# Patient Record
Sex: Male | Born: 1975 | Race: Black or African American | Hispanic: No | Marital: Single | Smoking: Current every day smoker
Health system: Southern US, Community
[De-identification: ages and names within clinical notes are randomized; demographics above are authoritative.]

---

## 2011-05-05 ENCOUNTER — Encounter (HOSPITAL_COMMUNITY): Payer: Self-pay

## 2011-05-05 ENCOUNTER — Emergency Department (HOSPITAL_COMMUNITY)
Admission: EM | Admit: 2011-05-05 | Discharge: 2011-05-05 | Disposition: A | Discharge status: Home / Self Care | Payer: Self-pay | Attending: Emergency Medicine | Admitting: Emergency Medicine

## 2011-05-05 DIAGNOSIS — Y92410 Unspecified street and highway as the place of occurrence of the external cause: Secondary | ICD-10-CM | POA: Insufficient documentation

## 2011-05-05 DIAGNOSIS — H00019 Hordeolum externum unspecified eye, unspecified eyelid: Secondary | ICD-10-CM | POA: Insufficient documentation

## 2011-05-05 DIAGNOSIS — L03213 Periorbital cellulitis: Secondary | ICD-10-CM

## 2011-05-05 DIAGNOSIS — IMO0002 Reserved for concepts with insufficient information to code with codable children: Secondary | ICD-10-CM | POA: Insufficient documentation

## 2011-05-05 DIAGNOSIS — H00039 Abscess of eyelid unspecified eye, unspecified eyelid: Secondary | ICD-10-CM | POA: Insufficient documentation

## 2011-05-05 DIAGNOSIS — T189XXA Foreign body of alimentary tract, part unspecified, initial encounter: Secondary | ICD-10-CM | POA: Insufficient documentation

## 2011-05-05 MED ORDER — CEPHALEXIN 500 MG PO CAPS: 500.0000 mg | ORAL_CAPSULE | Freq: Four times a day (QID) | ORAL | Status: AC

## 2011-05-05 MED ORDER — ERYTHROMYCIN 5 MG/GM OP OINT
1.0000 "application " | TOPICAL_OINTMENT | Freq: Once | OPHTHALMIC | Status: AC
  Administered 2011-05-05: 1 via OPHTHALMIC
  Filled 2011-05-05: qty 1

## 2011-05-05 MED ORDER — CEPHALEXIN 500 MG PO CAPS: 500.0000 mg | ORAL_CAPSULE | Freq: Four times a day (QID) | ORAL | Status: DC

## 2011-05-05 NOTE — ED Notes (Signed)
Pt here for possible ingestion of cocaine by gpd, also with cellulitis to eyes. Per dr Hyacinth Meeker hordoleum noted with periorbital cellulitis to right eye.

## 2011-05-05 NOTE — ED Provider Notes (Signed)
History     CSN: [id number]  Arrival date & time 05/05/11  0345   First MD Initiated Contact with Patient 05/05/11 [phone]      Chief Complaint  Patient presents with  . Swallowed Foreign Body    (Consider location/radiation/quality/duration/timing/severity/associated sxs/prior treatment) HPI Comments: According to Police, they pulled the pt over at traffic stop and caught him throwing cocaine in bags out of the car.  They did not witness any ingestions - they took him to jail - medical staff thought that he was a bit quiet and sleepy and requested ED evaluation by physician.  Pt has no c/o vomiting, no seizures, no c/o other than R eyelid swelling for 24 hours.   Pt denies swallowing any drugs.  Denies ETOH use  Patient is a 36 y.o. male presenting with foreign body swallowed. The history is provided by the patient and the police.  Swallowed Foreign Body    History reviewed. No pertinent past medical history.  History reviewed. No pertinent past surgical history.  History reviewed. No pertinent family history.  History  Substance Use Topics  . Smoking status: Current Everyday Smoker  . Smokeless tobacco: Not on file  . Alcohol Use:       Review of Systems  All other systems reviewed and are negative.    Allergies  Review of patient's allergies indicates not on file.  Home Medications   Current Outpatient Rx  Name Route Sig Dispense Refill  . CEPHALEXIN 500 MG PO CAPS Oral Take 1 capsule (500 mg total) by mouth 4 (four) times daily. 40 capsule 0    BP 137/81  Pulse 53  Temp(Src) 97.5 F (36.4 C) (Oral)  Resp 20  SpO2 95%  Physical Exam  Nursing note and vitals reviewed. Constitutional: He appears well-developed and well-nourished. No distress.  HENT:  Head: Normocephalic and atraumatic.  Mouth/Throat: Oropharynx is clear and moist. No oropharyngeal exudate.  Eyes: Conjunctivae and EOM are normal. Pupils are equal, round, and reactive to light. Right eye  exhibits no discharge. Left eye exhibits no discharge. No scleral icterus.       Right upper eyelid with erythema and mild tender with hordeolum present on lid margin.  No pain with EOM, normal pupillary exam bil.  Pupils are 3 mm and reaactive bil  Neck: Normal range of motion. Neck supple. No JVD present. No thyromegaly present.  Cardiovascular: Normal rate, regular rhythm, normal heart sounds and intact distal pulses.  Exam reveals no gallop and no friction rub.   No murmur heard. Pulmonary/Chest: Effort normal and breath sounds normal. No respiratory distress. He has no wheezes. He has no rales.  Abdominal: Soft. Bowel sounds are normal. He exhibits no distension and no mass. There is no tenderness.  Musculoskeletal: Normal range of motion. He exhibits no edema and no tenderness.  Lymphadenopathy:    He has no cervical adenopathy.  Neurological: He is alert. Coordination normal.  Skin: Skin is warm and dry. No rash noted. No erythema.  Psychiatric: He has a normal mood and affect. His behavior is normal.    ED Course  Procedures (including critical care time)  Labs Reviewed - No data to display No results found.   1. Periorbital cellulitis   2. Stye       MDM  Normal heart rate, normal abdominal exam, no signs of foreign body in the mouth. Patient does not appear to have ingested anything significant, has normal vital signs but does have a periorbital cellulitis of  the right eye along with a hordeolum. Antibiotics topical in the emergency department, Keflex by mouth for 10 days, prescription given, discharged back to prison in care of police. Patient appears stable at this time        Vida Roller, MD 05/05/11 [phone]

## 2011-05-05 NOTE — ED Notes (Signed)
MD at bedside. 

## 2011-05-05 NOTE — Discharge Instructions (Signed)
Periorbital Cellulitis Periorbital cellulitis is a common infection that can affect the eyelid and the soft tissues that surround the eyeball. The infection may also affect the structures that produce and drain tears. It does not affect the eyeball itself. Natural tissue barriers usually prevent the spread of this infection to the eyeball and other deeper areas of the eye socket.  CAUSES  Bacterial infection.   Long-term (chronic) sinus infections.   An object (foreign body) stuck behind the eye.   An injury that goes through the eyelid tissues.   An injury that causes an infection, such as an insect sting.   Fracture of the bone around the eye.   Infections which have spread from the eyelid or other structures around the eye.   Bite wounds.   Inflammation or infection of the lining membranes of the brain (meningitis).   An infection in the blood (septicemia).   Dental infection (abscess).   Viral infection (this is rare).  SYMPTOMS Symptoms usually come on suddenly.  Pain in the eye.   Red, hot, and swollen eyelids and possibly cheeks. The swelling is sometimes bad enough that the eyelids cannot open. Some infections make the eyelids look purple.   Fever and feeling generally ill.   Pain when touching the area around the eye.  DIAGNOSIS  Periorbital cellulitis can be diagnosed from an eye exam. In severe cases, your caregiver might suggest:  Blood tests.   Imaging tests (such as a CT scan) to examine the sinuses and the area around and behind the eyeball.  TREATMENT If your caregiver feels that you do not have any signs of serious infection, treatment may include:  Antibiotics.   Nasal decongestants to reduce swelling.   Referral to a dentist if it is suspected that the infection was caused by a prior tooth infection.   Examination every day to make sure the problem is improving.  HOME CARE INSTRUCTIONS  Take your antibiotics as directed. Finish them even  if you start to feel better.   Some pain is normal with this condition. Take pain medicine as directed by your caregiver. Only take pain medicines approved by your caregiver.   It is important to drink fluids. Drink enough water and fluids to keep your urine clear or pale yellow.   Do not smoke.   Rest and get plenty of sleep.   Mild or moderate fevers generally have no long-term effects and often do not require treatment.   If your caregiver has given you a follow-up appointment, it is very important to keep that appointment. Your caregiver will need to make sure that the infection is getting better. It is important to check that a more serious infection is not developing.  SEEK IMMEDIATE MEDICAL CARE IF:  Your eyelids become more painful, red, warm, or swollen.   You develop double vision or your vision becomes blurred or worsens in any way.   You have trouble moving your eyes.   The eye looks like it is popping out (proptosis).   You develop a severe headache, severe neck pain, or neck stiffness.   You develop repeated vomiting.   You have a fever or persistent symptoms for more than 72 hours.   You have a fever and your symptoms suddenly get worse.  MAKE SURE YOU:  Understand these instructions.   Will watch your condition.   Will get help right away if you are not doing well or get worse.  Document Released: 03/07/2010 Document Revised: 01/22/2011   Document Reviewed: 03/07/2010 [facility] Patient Information 2012 [place], [place].Sty A sty (hordeolum) is an infection of a gland in the eyelid located at the base of the eyelash. A sty may develop a white or yellow head of pus. It can be puffy (swollen). Usually, the sty will burst and pus will come out on its own. They do not leave lumps in the eyelid once they drain. A sty is often confused with another form of cyst of the eyelid called a chalazion. Chalazions occur within the eyelid and not on the edge where the bases of the  eyelashes are. They often are red, sore and then form firm lumps in the eyelid. CAUSES   Germs (bacteria).   Lasting (chronic) eyelid inflammation.  SYMPTOMS   Tenderness, redness and swelling along the edge of the eyelid at the base of the eyelashes.   Sometimes, there is a white or yellow head of pus. It may or may not drain.  DIAGNOSIS  An ophthalmologist will be able to distinguish between a sty and a chalazion and treat the condition appropriately.  TREATMENT   Styes are typically treated with warm packs (compresses) until drainage occurs.   In rare cases, medicines that kill germs (antibiotics) may be prescribed. These antibiotics may be in the form of drops, cream or pills.   If a hard lump has formed, it is generally necessary to do a small incision and remove the hardened contents of the cyst in a minor surgical procedure done in the office.   In suspicious cases, your caregiver may send the contents of the cyst to the lab to be certain that it is not a rare, but dangerous form of cancer of the glands of the eyelid.  HOME CARE INSTRUCTIONS   Wash your hands often and dry them with a clean towel. Avoid touching your eyelid. This may spread the infection to other parts of the eye.   Apply heat to your eyelid for 10 to 20 minutes, several times a day, to ease pain and help to heal it faster.   Do not squeeze the sty. Allow it to drain on its own. Wash your eyelid carefully 3 to 4 times per day to remove any pus.  SEEK IMMEDIATE MEDICAL CARE IF:   Your eye becomes painful or puffy (swollen).   Your vision changes.   Your sty does not drain by itself within 3 days.   Your sty comes back within a short period of time, even with treatment.   You have redness (inflammation) around the eye.   You have a fever.  Document Released: 11/12/2004 Document Revised: 01/22/2011 Document Reviewed: 07/17/2008 [facility] Patient Information 2012 [place], [place].

## 2011-05-05 NOTE — ED Notes (Signed)
Pt here from jail for possible ingestion of cocaine bags unknown amt.

## 2012-05-21 ENCOUNTER — Emergency Department (HOSPITAL_COMMUNITY)
Admission: EM | Admit: 2012-05-21 | Discharge: 2012-05-21 | Disposition: A | Discharge status: Home / Self Care | Payer: Self-pay | Attending: Emergency Medicine | Admitting: Emergency Medicine

## 2012-05-21 ENCOUNTER — Encounter (HOSPITAL_COMMUNITY): Payer: Self-pay | Admitting: Emergency Medicine

## 2012-05-21 DIAGNOSIS — F17200 Nicotine dependence, unspecified, uncomplicated: Secondary | ICD-10-CM | POA: Insufficient documentation

## 2012-05-21 DIAGNOSIS — R1012 Left upper quadrant pain: Secondary | ICD-10-CM | POA: Insufficient documentation

## 2012-05-21 DIAGNOSIS — R109 Unspecified abdominal pain: Secondary | ICD-10-CM

## 2012-05-21 DIAGNOSIS — Z79899 Other long term (current) drug therapy: Secondary | ICD-10-CM | POA: Insufficient documentation

## 2012-05-21 DIAGNOSIS — R112 Nausea with vomiting, unspecified: Secondary | ICD-10-CM | POA: Insufficient documentation

## 2012-05-21 LAB — COMPREHENSIVE METABOLIC PANEL
BUN: 10 mg/dL (ref 6–23)
CO2: 28 mEq/L (ref 19–32)
Chloride: 102 mEq/L (ref 96–112)
Creatinine, Ser: 1.29 mg/dL (ref 0.50–1.35)
GFR calc non Af Amer: 69 mL/min — ABNORMAL LOW (ref >90)
Total Bilirubin: 0.3 mg/dL (ref 0.3–1.2)

## 2012-05-21 LAB — CBC WITH DIFFERENTIAL/PLATELET
Basophils Relative: 0 % (ref 0–1)
HCT: 38.5 % — ABNORMAL LOW (ref 39.0–52.0)
Hemoglobin: 12.7 g/dL — ABNORMAL LOW (ref 13.0–17.0)
Lymphocytes Relative: 42 % (ref 12–46)
MCHC: 33 g/dL (ref 30.0–36.0)
MCV: 75.8 fL — ABNORMAL LOW (ref 78.0–100.0)
Monocytes Absolute: 0.7 10*3/uL (ref 0.1–1.0)
Monocytes Relative: 10 % (ref 3–12)
Neutro Abs: 3.2 10*3/uL (ref 1.7–7.7)

## 2012-05-21 LAB — LIPASE, BLOOD: Lipase: 36 U/L (ref 11–59)

## 2012-05-21 MED ORDER — ACETAMINOPHEN 325 MG PO TABS
650.0000 mg | ORAL_TABLET | Freq: Once | ORAL | Status: AC
  Administered 2012-05-21: 650 mg via ORAL
  Filled 2012-05-21: qty 2

## 2012-05-21 MED ORDER — ONDANSETRON HCL 4 MG/2ML IJ SOLN
4.0000 mg | Freq: Once | INTRAMUSCULAR | Status: AC
  Administered 2012-05-21: 4 mg via INTRAVENOUS
  Filled 2012-05-21: qty 2

## 2012-05-21 MED ORDER — PANTOPRAZOLE SODIUM 40 MG IV SOLR
40.0000 mg | Freq: Once | INTRAVENOUS | Status: AC
  Administered 2012-05-21: 40 mg via INTRAVENOUS
  Filled 2012-05-21: qty 40

## 2012-05-21 MED ORDER — SODIUM CHLORIDE 0.9 % IV BOLUS (SEPSIS)
1000.0000 mL | Freq: Once | INTRAVENOUS | Status: AC
  Administered 2012-05-21: 1000 mL via INTRAVENOUS

## 2012-05-21 MED ORDER — PROMETHAZINE HCL 25 MG PO TABS: 25.0000 mg | ORAL_TABLET | Freq: Four times a day (QID) | ORAL | Status: DC | PRN

## 2012-05-21 NOTE — ED Notes (Signed)
Pt c/o abd pain, n/v x 3 days, worse last night after eating dinner.

## 2012-05-21 NOTE — ED Provider Notes (Signed)
History     CSN: [id number]  Arrival date & time 05/21/12  0154   First MD Initiated Contact with Patient 05/21/12 [phone]      Chief Complaint  Patient presents with  . Abdominal Pain  . Emesis    (Consider location/radiation/quality/duration/timing/severity/associated sxs/prior treatment) HPI  Jacobey Gura Redmon is a 37 y.o. male complaining of complaining of left upper quadrant abdominal pain x3 days with single episode of nonbloody nonbilious emesis today. Patient states the pain is 8/10, it is described as gnawing, patient has not taken any pain medications. He denies any change in bowel or bladder habits, fever, chest pain, shortness of breath.   History reviewed. No pertinent past medical history.  History reviewed. No pertinent past surgical history.  No family history on file.  History  Substance Use Topics  . Smoking status: Current Every Day Smoker  . Smokeless tobacco: Not on file  . Alcohol Use: Yes     Comment: occ      Review of Systems  Constitutional: Negative for fever.  Respiratory: Negative for shortness of breath.   Cardiovascular: Negative for chest pain.  Gastrointestinal: Positive for nausea, vomiting and abdominal pain. Negative for diarrhea.  All other systems reviewed and are negative.    Allergies  Review of patient's allergies indicates no known allergies.  Home Medications   Current Outpatient Rx  Name  Route  Sig  Dispense  Refill  . METFORMIN HCL PO   Oral   Take 1 tablet by mouth once.         . simethicone (MYLICON) 80 MG chewable tablet   Oral   Chew 80 mg by mouth every 6 (six) hours as needed for flatulence.           BP 137/79  Pulse 63  Temp(Src) 98.4 F (36.9 C) (Oral)  Resp 16  Ht 5\' 10"  (1.778 m)  SpO2 96%  Physical Exam  Nursing note and vitals reviewed. Constitutional: He is oriented to person, place, and time. He appears well-developed and well-nourished. No distress.  HENT:  Head: Normocephalic.   Mouth/Throat: Oropharynx is clear and moist.  Eyes: Conjunctivae and EOM are normal. Pupils are equal, round, and reactive to light.  Neck: Normal range of motion.  Cardiovascular: Normal rate, regular rhythm and intact distal pulses.   Pulmonary/Chest: Effort normal and breath sounds normal. No stridor. No respiratory distress. He has no wheezes. He has no rales. He exhibits no tenderness.  Abdominal: Soft. Bowel sounds are normal. He exhibits no distension and no mass. There is no tenderness. There is no rebound and no guarding.  Musculoskeletal: Normal range of motion.  Neurological: He is alert and oriented to person, place, and time.  Psychiatric: He has a normal mood and affect.    ED Course  Procedures (including critical care time)  Labs Reviewed  CBC WITH DIFFERENTIAL - Abnormal; Notable for the following:    Hemoglobin 12.7 (*)    HCT 38.5 (*)    MCV 75.8 (*)    MCH 25.0 (*)    All other components within normal limits  COMPREHENSIVE METABOLIC PANEL - Abnormal; Notable for the following:    Potassium 3.4 (*)    Glucose, Bld 136 (*)    GFR calc non Af Amer 69 (*)    GFR calc Af Amer 80 (*)    All other components within normal limits  LIPASE, BLOOD   No results found.   1. Abdominal pain   2. Nausea &  vomiting       MDM   Asaad Gulley is a 37 y.o. male with nausea vomiting and abdominal pain.  Patient is extremely somnolent and I think this is likely secondary to alcohol or drug use. This limits pain medications that  can safely effectively be utilized.  Patient has nonsurgical abdomen, this remains consistent over serial exams.  VSS Patient tolerated by mouth challenge is appropriate for discharge at this time. Return precautions given.   Filed Vitals:   05/21/12 0211  BP: 137/79  Pulse: 63  Temp: 98.4 F (36.9 C)  TempSrc: Oral  Resp: 16  Height: 5\' 10"  (1.778 m)  SpO2: 96%     Pt verbalized understanding and agrees with care plan.  Outpatient follow-up and return precautions given.    New Prescriptions   PROMETHAZINE (PHENERGAN) 25 MG TABLET    Take 1 tablet (25 mg total) by mouth every 6 (six) hours as needed for nausea.           Wynetta Emery, PA-C 05/23/12 1527

## 2012-05-21 NOTE — ED Notes (Signed)
Pt drank water with no adverse s/s

## 2012-05-24 NOTE — ED Provider Notes (Signed)
Medical screening examination/treatment/procedure(s) were performed by non-physician practitioner and as supervising physician I was immediately available for consultation/collaboration.  Sunnie Nielsen, MD 05/24/12 [phone]

## 2013-11-12 ENCOUNTER — Encounter (HOSPITAL_COMMUNITY): Payer: Self-pay | Admitting: Emergency Medicine

## 2013-11-12 ENCOUNTER — Emergency Department (HOSPITAL_COMMUNITY)
Admission: EM | Admit: 2013-11-12 | Discharge: 2013-11-12 | Disposition: A | Discharge status: Home / Self Care | Payer: Self-pay | Attending: Emergency Medicine | Admitting: Emergency Medicine

## 2013-11-12 DIAGNOSIS — F17200 Nicotine dependence, unspecified, uncomplicated: Secondary | ICD-10-CM | POA: Insufficient documentation

## 2013-11-12 DIAGNOSIS — R51 Headache: Secondary | ICD-10-CM | POA: Insufficient documentation

## 2013-11-12 DIAGNOSIS — R5383 Other fatigue: Secondary | ICD-10-CM

## 2013-11-12 DIAGNOSIS — R6883 Chills (without fever): Secondary | ICD-10-CM | POA: Insufficient documentation

## 2013-11-12 DIAGNOSIS — R519 Headache, unspecified: Secondary | ICD-10-CM

## 2013-11-12 DIAGNOSIS — R5381 Other malaise: Secondary | ICD-10-CM | POA: Insufficient documentation

## 2013-11-12 MED ORDER — ACETAMINOPHEN 500 MG PO TABS
1000.0000 mg | ORAL_TABLET | Freq: Once | ORAL | Status: AC
  Administered 2013-11-12: 1000 mg via ORAL
  Filled 2013-11-12: qty 2

## 2013-11-12 MED ORDER — METHOCARBAMOL 500 MG PO TABS
1000.0000 mg | ORAL_TABLET | Freq: Once | ORAL | Status: AC
  Administered 2013-11-12: 1000 mg via ORAL
  Filled 2013-11-12: qty 2

## 2013-11-12 MED ORDER — METHOCARBAMOL 750 MG PO TABS
750.0000 mg | ORAL_TABLET | Freq: Four times a day (QID) | ORAL | Status: AC | PRN
Start: 2013-11-12 — End: ?

## 2013-11-12 NOTE — ED Provider Notes (Signed)
CSN: [id number]     Arrival date & time 11/12/13  0409 History   First MD Initiated Contact with Patient 11/12/13 [phone]     Chief Complaint  Patient presents with  . Headache     (Consider location/radiation/quality/duration/timing/severity/associated sxs/prior Treatment) HPI 38 year old male presents to emergency department from with complaint of posterior headache.  Headache started around lunchtime today.  Patient does not normally have headaches.  No nausea vomiting photophobia.  No trauma to the area.  Patient has had some malaise with chills but no fever.  Patient reports palpation of the posterior part of his head and movement causes worsening pain.  Patient has not taken any medication for this pain prior to arrival. History reviewed. No pertinent past medical history. History reviewed. No pertinent past surgical history. No family history on file. History  Substance Use Topics  . Smoking status: Current Every Day Smoker  . Smokeless tobacco: Not on file  . Alcohol Use: Yes     Comment: occ    Review of Systems   See History of Present Illness; otherwise all other systems are reviewed and negative  Allergies  Review of patient's allergies indicates no known allergies.  Home Medications   Prior to Admission medications   Not on File   BP 133/94  Pulse 59  Temp(Src) 98.9 F (37.2 C) (Oral)  Resp 17  Ht 5' 10.5" (1.791 m)  Wt 190 lb (86.183 kg)  BMI 26.87 kg/m2  SpO2 97% Physical Exam  Nursing note and vitals reviewed. Constitutional: He is oriented to person, place, and time. He appears well-developed and well-nourished.  HENT:  Head: Normocephalic and atraumatic.  Right Ear: External ear normal.  Left Ear: External ear normal.  Nose: Nose normal.  Mouth/Throat: Oropharynx is clear and moist.  Occiput, no overlying skin changes, no erythema, no warmth no crepitus  Eyes: Conjunctivae and EOM are normal. Pupils are equal, round, and reactive to light.  Neck:  Normal range of motion. Neck supple. No JVD present. No tracheal deviation present. No thyromegaly present.  Patient has no nuchal rigidity.  Flexion and extension and rotation cause pain at the occiput but he has no focal findings to palpation of cervical spine  Cardiovascular: Normal rate, regular rhythm, normal heart sounds and intact distal pulses.  Exam reveals no gallop and no friction rub.   No murmur heard. Pulmonary/Chest: Effort normal and breath sounds normal. No stridor. No respiratory distress. He has no wheezes. He has no rales. He exhibits no tenderness.  Abdominal: Soft. Bowel sounds are normal. He exhibits no distension and no mass. There is no tenderness. There is no rebound and no guarding.  Musculoskeletal: Normal range of motion. He exhibits no edema and no tenderness.  Lymphadenopathy:    He has no cervical adenopathy.  Neurological: He is alert and oriented to person, place, and time. He displays normal reflexes. He exhibits normal muscle tone. Coordination normal.  Skin: Skin is warm and dry. No rash noted. No erythema. No pallor.  Psychiatric: He has a normal mood and affect. His behavior is normal. Judgment and thought content normal.    ED Course  Procedures (including critical care time) Labs Review Labs Reviewed - No data to display  Imaging Review No results found.   EKG Interpretation None      MDM   Final diagnoses:  Acute nonintractable headache, unspecified headache type    38 year old male with occipital headache.  Plan for treatment with Tylenol and Robaxin.  We'll  reassess.  I feel patient does not have meningitis, cervical spine arrangement, SAH or other serious cause of his headache.  Olivia Mackie, MD 11/12/13 [phone]

## 2013-11-12 NOTE — Discharge Instructions (Signed)
She may take Tylenol or a Irving Copas if you have any headache.  Robaxin as needed for muscle tension or spasm.  Return to the emergency department for worsening condition or new concerning symptoms.

## 2013-11-12 NOTE — ED Notes (Signed)
The pt is c/o a headache all day no n or v.  His pain is in the back of his head.  No hx of headaches

## 2013-11-15 ENCOUNTER — Encounter (HOSPITAL_COMMUNITY): Payer: Self-pay | Admitting: Emergency Medicine

## 2013-11-15 ENCOUNTER — Emergency Department (HOSPITAL_COMMUNITY)
Admission: EM | Admit: 2013-11-15 | Discharge: 2013-11-15 | Disposition: A | Discharge status: Home / Self Care | Payer: Self-pay | Attending: Emergency Medicine | Admitting: Emergency Medicine

## 2013-11-15 ENCOUNTER — Emergency Department (HOSPITAL_COMMUNITY): Payer: Self-pay

## 2013-11-15 DIAGNOSIS — R51 Headache: Secondary | ICD-10-CM | POA: Insufficient documentation

## 2013-11-15 DIAGNOSIS — R69 Illness, unspecified: Secondary | ICD-10-CM

## 2013-11-15 DIAGNOSIS — Z7982 Long term (current) use of aspirin: Secondary | ICD-10-CM | POA: Insufficient documentation

## 2013-11-15 DIAGNOSIS — B9789 Other viral agents as the cause of diseases classified elsewhere: Secondary | ICD-10-CM | POA: Insufficient documentation

## 2013-11-15 DIAGNOSIS — J029 Acute pharyngitis, unspecified: Secondary | ICD-10-CM | POA: Insufficient documentation

## 2013-11-15 DIAGNOSIS — B349 Viral infection, unspecified: Secondary | ICD-10-CM

## 2013-11-15 DIAGNOSIS — J111 Influenza due to unidentified influenza virus with other respiratory manifestations: Secondary | ICD-10-CM | POA: Insufficient documentation

## 2013-11-15 DIAGNOSIS — M791 Myalgia, unspecified site: Secondary | ICD-10-CM

## 2013-11-15 DIAGNOSIS — F17200 Nicotine dependence, unspecified, uncomplicated: Secondary | ICD-10-CM | POA: Insufficient documentation

## 2013-11-15 LAB — BASIC METABOLIC PANEL
Anion gap: 11 (ref 5–15)
BUN: 9 mg/dL (ref 6–23)
CALCIUM: 9.1 mg/dL (ref 8.4–10.5)
CO2: 24 meq/L (ref 19–32)
CREATININE: 1.1 mg/dL (ref 0.50–1.35)
Chloride: 105 mEq/L (ref 96–112)
GFR calc Af Amer: >90 mL/min (ref >90)
GFR, EST NON AFRICAN AMERICAN: 82 mL/min — AB (ref >90)
GLUCOSE: 133 mg/dL — AB (ref 70–99)
Potassium: 3.8 mEq/L (ref 3.7–5.3)
Sodium: 140 mEq/L (ref 137–147)

## 2013-11-15 LAB — CBC
HEMATOCRIT: 35.6 % — AB (ref 39.0–52.0)
HEMOGLOBIN: 11.7 g/dL — AB (ref 13.0–17.0)
MCH: 24.9 pg — AB (ref 26.0–34.0)
MCHC: 32.9 g/dL (ref 30.0–36.0)
MCV: 75.9 fL — AB (ref 78.0–100.0)
Platelets: 143 10*3/uL — ABNORMAL LOW (ref 150–400)
RBC: 4.69 MIL/uL (ref 4.22–5.81)
RDW: 14.7 % (ref 11.5–15.5)
WBC: 5.3 10*3/uL (ref 4.0–10.5)

## 2013-11-15 MED ORDER — ACETAMINOPHEN 325 MG PO TABS
650.0000 mg | ORAL_TABLET | Freq: Once | ORAL | Status: AC
  Administered 2013-11-15: 650 mg via ORAL
  Filled 2013-11-15: qty 2

## 2013-11-15 MED ORDER — OXYMETAZOLINE HCL 0.05 % NA SOLN
1.0000 | Freq: Once | NASAL | Status: AC
  Administered 2013-11-15: 1 via NASAL
  Filled 2013-11-15: qty 15

## 2013-11-15 MED ORDER — KETOROLAC TROMETHAMINE 30 MG/ML IJ SOLN
30.0000 mg | Freq: Once | INTRAMUSCULAR | Status: AC
  Administered 2013-11-15: 30 mg via INTRAVENOUS
  Filled 2013-11-15: qty 1

## 2013-11-15 MED ORDER — AEROCHAMBER PLUS W/MASK MISC
1.0000 | Freq: Once | Status: AC
  Administered 2013-11-15: 1
  Filled 2013-11-15: qty 1

## 2013-11-15 MED ORDER — GUAIFENESIN ER 600 MG PO TB12: 1200.0000 mg | ORAL_TABLET | Freq: Two times a day (BID) | ORAL | Status: DC

## 2013-11-15 MED ORDER — ALBUTEROL SULFATE HFA 108 (90 BASE) MCG/ACT IN AERS
2.0000 | INHALATION_SPRAY | RESPIRATORY_TRACT | Status: DC | PRN
  Administered 2013-11-15: 2 via RESPIRATORY_TRACT
  Filled 2013-11-15: qty 6.7

## 2013-11-15 MED ORDER — HYDROCODONE-ACETAMINOPHEN 5-325 MG PO TABS
2.0000 | ORAL_TABLET | Freq: Once | ORAL | Status: AC
  Administered 2013-11-15: 2 via ORAL
  Filled 2013-11-15: qty 2

## 2013-11-15 MED ORDER — ALBUTEROL SULFATE (2.5 MG/3ML) 0.083% IN NEBU
5.0000 mg | INHALATION_SOLUTION | Freq: Once | RESPIRATORY_TRACT | Status: AC
  Administered 2013-11-15: 5 mg via RESPIRATORY_TRACT
  Filled 2013-11-15: qty 6

## 2013-11-15 MED ORDER — FLUTICASONE PROPIONATE 50 MCG/ACT NA SUSP: 2.0000 | Freq: Every day | NASAL | Status: AC

## 2013-11-15 MED ORDER — SODIUM CHLORIDE 0.9 % IV BOLUS (SEPSIS)
1000.0000 mL | INTRAVENOUS | Status: AC
  Administered 2013-11-15: 1000 mL via INTRAVENOUS

## 2013-11-15 MED ORDER — HYDROCODONE-HOMATROPINE 5-1.5 MG/5ML PO SYRP
5.0000 mL | ORAL_SOLUTION | Freq: Four times a day (QID) | ORAL | Status: DC | PRN
Start: 2013-11-15 — End: 2013-11-27

## 2013-11-15 MED ORDER — HYDROCODONE-ACETAMINOPHEN 5-325 MG PO TABS: 1.0000 | ORAL_TABLET | Freq: Four times a day (QID) | ORAL | Status: DC | PRN

## 2013-11-15 NOTE — ED Provider Notes (Signed)
Medical screening examination/treatment/procedure(s) were performed by non-physician practitioner and as supervising physician I was immediately available for consultation/collaboration.  Sosha Shepherd T Jhene Westmoreland, MD 11/15/13 2313 

## 2013-11-15 NOTE — ED Provider Notes (Signed)
CSN: [id number]     Arrival date & time 11/15/13  1738 History   First MD Initiated Contact with Patient 11/15/13 1819     Chief Complaint  Patient presents with  . Headache  . Generalized Body Aches     (Consider location/radiation/quality/duration/timing/severity/associated sxs/prior Treatment) The history is provided by the patient and medical records. No language interpreter was used.    Andre Peters is a 38 y.o. male  with no major medical problems presents to the Emergency Department complaining of gradual, persistent, progressively worsening headache onset 4 days ago. Associated symptoms include eye soreness, generalized myalgias, nasal congestion, rhinorrhea, sore throat, cough.  Pt reports he was given tylenol and robaxin for a muscle spasm and pt reports the pain improved, but did not resolve.  Pt's wife denies sick contacts, but pt does work with the public.  Robaxin makes it better and coughing makes the headache worse.  Pt has been taking Excedrin migraine helped significantly at first, but the symptoms have progressed.  Pt denies fever, chest pain, SOB, abd pain, N/V/D, weakness, dizziness, syncope, dysuria, hematuria.  Marland Kitchen     History reviewed. No pertinent past medical history. History reviewed. No pertinent past surgical history. No family history on file. History  Substance Use Topics  . Smoking status: Current Every Day Smoker    Types: Cigarettes  . Smokeless tobacco: Not on file  . Alcohol Use: Yes     Comment: occ    Review of Systems  Constitutional: Positive for fatigue. Negative for fever, chills and appetite change.  HENT: Positive for congestion, postnasal drip, rhinorrhea, sinus pressure and sore throat. Negative for ear discharge, ear pain and mouth sores.   Eyes: Negative for visual disturbance.  Respiratory: Positive for cough. Negative for chest tightness, shortness of breath, wheezing and stridor.   Cardiovascular: Negative for chest pain,  palpitations and leg swelling.  Gastrointestinal: Negative for nausea, vomiting, abdominal pain and diarrhea.  Genitourinary: Negative for dysuria, urgency, frequency and hematuria.  Musculoskeletal: Negative for arthralgias, back pain, myalgias and neck stiffness.  Skin: Negative for rash.  Neurological: Positive for headaches. Negative for syncope, light-headedness and numbness.  Hematological: Negative for adenopathy.  Psychiatric/Behavioral: The patient is not nervous/anxious.   All other systems reviewed and are negative.     Allergies  Fish allergy  Home Medications   Prior to Admission medications   Medication Sig Start Date End Date Taking? Authorizing Provider  aspirin-acetaminophen-caffeine (EXCEDRIN MIGRAINE) [id number] MG per tablet Take 2 tablets by mouth every 6 (six) hours as needed for headache.   Yes Historical Provider, MD  fluticasone (FLONASE) 50 MCG/ACT nasal spray Place 2 sprays into both nostrils daily. 11/15/13   Idriss Quackenbush, PA-C  guaiFENesin (MUCINEX) 600 MG 12 hr tablet Take 2 tablets (1,200 mg total) by mouth 2 (two) times daily. 11/15/13   Arnetra Terris, PA-C  HYDROcodone-acetaminophen (NORCO/VICODIN) 5-325 MG per tablet Take 1 tablet by mouth every 6 (six) hours as needed for severe pain. 11/15/13   Cebert Dettmann, PA-C  HYDROcodone-homatropine (HYCODAN) 5-1.5 MG/5ML syrup Take 5 mLs by mouth every 6 (six) hours as needed for cough. 11/15/13   Piera Downs, PA-C  methocarbamol (ROBAXIN-750) 750 MG tablet Take 1 tablet (750 mg total) by mouth every 6 (six) hours as needed for muscle spasms. 11/12/13   Olivia Mackie, MD   BP 145/90  Pulse 56  Temp(Src) 99.1 F (37.3 C) (Oral)  Resp 16  SpO2 98% Physical Exam  Nursing note and  vitals reviewed. Constitutional: He is oriented to person, place, and time. He appears well-developed and well-nourished. No distress.  HENT:  Head: Normocephalic and atraumatic.  Right Ear: Tympanic membrane,  external ear and ear canal normal.  Left Ear: Tympanic membrane, external ear and ear canal normal.  Nose: Mucosal edema and rhinorrhea present. No epistaxis. Right sinus exhibits no maxillary sinus tenderness and no frontal sinus tenderness. Left sinus exhibits no maxillary sinus tenderness and no frontal sinus tenderness.  Mouth/Throat: Uvula is midline, oropharynx is clear and moist and mucous membranes are normal. Mucous membranes are not pale and not cyanotic. No oropharyngeal exudate, posterior oropharyngeal edema, posterior oropharyngeal erythema or tonsillar abscesses.  Eyes: Conjunctivae and EOM are normal. Pupils are equal, round, and reactive to light. No scleral icterus.  No horizontal, vertical or rotational nystagmus  Neck: Normal range of motion and full passive range of motion without pain. Neck supple.  Full active and passive ROM without pain No midline or paraspinal tenderness No nuchal rigidity or meningeal signs  Cardiovascular: Normal rate, regular rhythm, normal heart sounds and intact distal pulses.   No murmur heard. Pulmonary/Chest: Effort normal and breath sounds normal. No stridor. No respiratory distress. He has no wheezes. He has no rales.  Clear and equal breath sounds without focal wheezes, rhonchi, rales  Abdominal: Soft. Bowel sounds are normal. There is no tenderness. There is no rebound and no guarding.  Musculoskeletal: Normal range of motion.  Lymphadenopathy:    He has no cervical adenopathy.  Neurological: He is alert and oriented to person, place, and time. He has normal reflexes. No cranial nerve deficit. He exhibits normal muscle tone. Coordination normal.  Mental Status:  Alert, oriented, thought content appropriate. Speech fluent without evidence of aphasia. Able to follow 2 step commands without difficulty.  Cranial Nerves:  II:  Peripheral visual fields grossly normal, pupils equal, round, reactive to light III,IV, VI: ptosis not present,  extra-ocular motions intact bilaterally  V,VII: smile symmetric, facial light touch sensation equal VIII: hearing grossly normal bilaterally  IX,X: gag reflex present  XI: bilateral shoulder shrug equal and strong XII: midline tongue extension  Motor:  5/5 in upper and lower extremities bilaterally including strong and equal grip strength and dorsiflexion/plantar flexion Sensory: Pinprick and light touch normal in all extremities.  Deep Tendon Reflexes: 2+ and symmetric  Cerebellar: normal finger-to-nose with bilateral upper extremities Gait: normal gait and balance CV: distal pulses palpable throughout   Skin: Skin is warm and dry. No rash noted. He is not diaphoretic.  Psychiatric: He has a normal mood and affect. His behavior is normal. Judgment and thought content normal.    ED Course  Procedures (including critical care time) Labs Review Labs Reviewed  CBC - Abnormal; Notable for the following:    Hemoglobin 11.7 (*)    HCT 35.6 (*)    MCV 75.9 (*)    MCH 24.9 (*)    Platelets 143 (*)    All other components within normal limits  BASIC METABOLIC PANEL - Abnormal; Notable for the following:    Glucose, Bld 133 (*)    GFR calc non Af Amer 82 (*)    All other components within normal limits    Imaging Review Dg Chest 2 View  11/15/2013   CLINICAL DATA:  Body aches with cough and sweats ; history of tobacco use  EXAM: CHEST  2 VIEW  COMPARISON:  None.  FINDINGS: The lungs are mildly hyperinflated. There is no focal infiltrate.  The heart and pulmonary vascularity appear normal. There is no pleural effusion or pneumothorax. The bony thorax is unremarkable.  IMPRESSION: There is hyperinflation consistent with COPD or reactive airway disease. There may be superimposed acute bronchitis in the appropriate clinical setting.   Electronically Signed   By: David  [place]   On: 11/15/2013 19:34     EKG Interpretation None      MDM   Final diagnoses:  Influenza-like illness  Viral  syndrome  Myalgia   Andre Peters presents with influenza like symptoms and no treatments PTA.  Pt is without signs of systemic infection. Will give fluids and torradol.  Pt requests that we check labs and will obtain CXR.    8:34 PM CXR with evidence of bronchitis; likely viral in nature given pt's symptoms.  No leukocytosis or other concerning labs.  Headache improving.  9:08 PM Pt CXR negative for acute infiltrate; COPD and bronchitic changes noted. Patient given albuterol treatment and this has decreased his cough. He reports he is breathing much better and is generally feeling better. He reports significant improvement in his headache.   Presentation is non concerning for [facility], ICH, Meningitis, or temporal arteritis. Pt is afebrile with no focal neuro deficits, nuchal rigidity, or change in vision. No concern for meningitis.  Patients symptoms are consistent with URI, likely viral etiology. Discussed that antibiotics are not indicated for viral infections. Pt will be discharged with symptomatic treatment.  Verbalizes understanding and is agreeable with plan. Pt is hemodynamically stable & in NAD prior to dc.  I have personally reviewed patient's vitals, nursing note and any pertinent labs or imaging.  I performed an undressed physical exam.    It has been determined that no acute conditions requiring further emergency intervention are present at this time. The patient/guardian have been advised of the diagnosis and plan. I reviewed all labs and imaging including any potential incidental findings.   Vital signs are stable at discharge.   BP 145/90  Pulse 56  Temp(Src) 99.1 F (37.3 C) (Oral)  Resp 16  SpO2 98%         Dierdre Forth, PA-C 11/15/13 2112

## 2013-11-15 NOTE — Discharge Instructions (Signed)
1. Medications: Flonase, mucinex, Hycodan, vicodin, usual home medications 2. Treatment: rest, drink plenty of fluids, take tylenol or ibuprofen for fever control 3. Follow Up: Please followup with your primary doctor for discussion of your diagnoses and further evaluation after today's visit; if you do not have a primary care doctor use the resource guide provided to find one;    Influenza Influenza ("the flu") is a viral infection of the respiratory tract. It occurs more often in winter months because people spend more time in close contact with one another. Influenza can make you feel very sick. Influenza easily spreads from person to person (contagious). CAUSES  Influenza is caused by a virus that infects the respiratory tract. You can catch the virus by breathing in droplets from an infected person's cough or sneeze. You can also catch the virus by touching something that was recently contaminated with the virus and then touching your mouth, nose, or eyes. RISKS AND COMPLICATIONS You may be at risk for a more severe case of influenza if you smoke cigarettes, have diabetes, have chronic heart disease (such as heart failure) or lung disease (such as asthma), or if you have a weakened immune system. Elderly people and pregnant women are also at risk for more serious infections. The most common problem of influenza is a lung infection (pneumonia). Sometimes, this problem can require emergency medical care and may be life threatening. SIGNS AND SYMPTOMS  Symptoms typically last 4 to 10 days and may include:  Fever.  Chills.  Headache, body aches, and muscle aches.  Sore throat.  Chest discomfort and cough.  Poor appetite.  Weakness or feeling tired.  Dizziness.  Nausea or vomiting. DIAGNOSIS  Diagnosis of influenza is often made based on your history and a physical exam. A nose or throat swab test can be done to confirm the diagnosis. TREATMENT  In mild cases, influenza goes away on  its own. Treatment is directed at relieving symptoms. For more severe cases, your health care provider may prescribe antiviral medicines to shorten the sickness. Antibiotic medicines are not effective because the infection is caused by a virus, not by bacteria. HOME CARE INSTRUCTIONS  Take medicines only as directed by your health care provider.  Use a cool mist humidifier to make breathing easier.  Get plenty of rest until your temperature returns to normal. This usually takes 3 to 4 days.  Drink enough fluid to keep your urine clear or pale yellow.  Cover yourmouth and nosewhen coughing or sneezing,and wash your handswellto prevent thevirusfrom spreading.  Stay homefromwork orschool untilthe fever is gonefor at least 581full day. PREVENTION  An annual influenza vaccination (flu shot) is the best way to avoid getting influenza. An annual flu shot is now routinely recommended for all adults in the [place] SEEK MEDICAL CARE IF:  You experiencechest pain, yourcough worsens,or you producemore mucus.  Youhave nausea,vomiting, ordiarrhea.  Your fever returns or gets worse. SEEK IMMEDIATE MEDICAL CARE IF:  You havetrouble breathing, you become short of breath,or your skin ornails becomebluish.  You have severe painor stiffnessin the neck.  You develop a sudden headache, or pain in the face or ear.  You have nausea or vomiting that you cannot control. MAKE SURE YOU:   Understand these instructions.  Will watch your condition.  Will get help right away if you are not doing well or get worse. Document Released: 01/31/2000 Document Revised: 06/19/2013 Document Reviewed: 05/04/2011 [facility] Patient Information 2015 [employer] [place]. This information is not intended to replace  advice given to you by your health care provider. Make sure you discuss any questions you have with your health care provider.    Emergency Department Resource Guide 1) Find a Doctor and Pay  Out of Pocket Although you won't have to find out who is covered by your insurance plan, it is a good idea to ask around and get recommendations. You will then need to call the office and see if the doctor you have chosen will accept you as a new patient and what types of options they offer for patients who are self-pay. Some doctors offer discounts or will set up payment plans for their patients who do not have insurance, but you will need to ask so you aren't surprised when you get to your appointment.  2) Contact Your Local Health Department Not all health departments have doctors that can see patients for sick visits, but many do, so it is worth a call to see if yours does. If you don't know where your local health department is, you can check in your phone book. The CDC also has a tool to help you locate your state's health department, and many state websites also have listings of all of their local health departments.  3) Find a Walk-in Clinic If your illness is not likely to be very severe or complicated, you may want to try a walk in clinic. These are popping up all over the country in pharmacies, drugstores, and shopping centers. They're usually staffed by nurse practitioners or physician assistants that have been trained to treat common illnesses and complaints. They're usually fairly quick and inexpensive. However, if you have serious medical issues or chronic medical problems, these are probably not your best option.  No Primary Care Doctor: - Call Health Connect at  [phone] - they can help you locate a primary care doctor that  accepts your insurance, provides certain services, etc. - Physician Referral Service- [phone]  Chronic Pain Problems: Organization         Address  Phone   Notes  [facility]  [phone] Patients need to be referred by their primary care doctor.   Medication Assistance: Organization         Address  Phone   Notes  [facility]  Medication [facility] [address] [place], [place] [address] [phone] --Must be a resident of [facility] -- Must have NO insurance coverage whatsoever (no Medicaid/ Medicare, etc.) -- The pt. MUST have a primary care doctor that directs their care regularly and follows them in the community   [facility]  [phone]   [employer]  [phone]    Agencies that provide inexpensive medical care: Organization         Address  Phone   Notes  [facility]  [phone]   [facility]    [phone]   [facility] [address] [place], [place] [address] [phone]   [facility] [address] [place], [place] [phone]   Planned Parenthood    [phone]   [facility]    [phone]   [facility]  [address], [place] Phone:  [phone], Fax:  [phone] Hours of Operation:  9 am - 6 pm, M-F.  Also accepts Medicaid/Medicare and self-pay.  [facility]  [address], [place] Phone: (  [phone], Fax: [phone]. Hours of Operation:  8:30 am - 5:30 pm, M-F.  Also accepts Medicaid and self-pay.  [facility] [place] [address], [place] Phone: [phone]   [facility] [address] Natasha Bence [place], [place] [phone], Ext. [phone] Mondays & Thursdays: 7-9 AM.  First 15 patients are seen on a first come, first serve basis.    Medicaid-accepting [facility] Providers:  Organization         Address  Phone   Notes  [facility] [address], [place] [phone] Also accepts self-pay patients.  [facility] [address] Laurell Josephs [place], [place]  [phone]   [facility] [address], [place] [phone]   [facility] [address] [place] [phone]   Renaye Rakers [address], [place]   [phone] Only accepts [place] Access [place] patients after they have their name applied to their card.   Self-Pay (no insurance) in [facility]:  Organization         Address  Phone   Notes  Sickle Cell Patients, [facility] [address] [place], [place] [phone]   [facility] [address] [place], [place] [phone]   [facility] [place]  [address], [place] [phone]   [facility]/Dr. Osei-Bonsu  [address] [place] or [address], [place] [phone] Phone number for both [place] and [place] locations is the same.  [facility] [address], [place] [phone]   [facility] [address] [place] or [address] [phone]   [facility] [address] [place] [phone], phone; [phone], fax Sees patients 1st and 3rd Saturday of every month.  Must not qualify for public or private insurance (i.e. Medicaid, Medicare, [place] Health Choice, Veterans' Benefits)  Household income should be no more than 200% of the poverty level The clinic cannot treat you if you are pregnant or think you are pregnant  Sexually transmitted diseases are not treated at the clinic.    Dental Care: Organization         Address  Phone  Notes  [facility] Department of [facility] [address] [place], [place] [phone] Accepts children up to age 30 who are enrolled in [place] or [place] Health Choice; pregnant women with a Medicaid card; and children who have applied for Medicaid or [place] Health Choice, but were declined, whose parents can pay a reduced fee at time of service.  [facility] of [facility]  [address], [place]  [phone] Accepts children up to age 67 who are enrolled in [place] or [place] Health Choice; pregnant women with a Medicaid card; and children who have applied for Medicaid or [place] Health Choice, but were declined, whose parents can pay a reduced fee at time of service.  [facility]  [address] [place], [place] [phone] Patients are seen by appointment only. Walk-ins are not accepted. [facility] will see patients 44 years of age and older. Monday - Tuesday (8am-5pm) Most Wednesdays (8:30-5pm) $30 per visit, cash only  [facility]  [address],  [place] [phone] Patients are seen by appointment only. Walk-ins are not accepted. [facility] will see patients 66 years of age and older. One Wednesday Evening (Monthly: Volunteer Based).  $30 per visit, cash only  [employer]  [phone] for adults; Children under age 18, call Graduate Pediatric Dentistry at [phone]. Children aged 33-14, please call [phone] to request a pediatric application.  Dental services are provided in all areas of dental care including fillings, crowns and bridges, complete and partial dentures, implants, gum treatment, root canals, and extractions. Preventive care is also provided. Treatment is provided to both adults and children. Patients are selected via a lottery and there is often a waiting list.   [facility] [address] [place]  [phone] [email or web address]   [facility] [address] [place], [place] [phone], Ext. [phone] Second and Fourth Thursday of each month, opens at 6:30 AM; Clinic ends at 9 AM.  Patients are seen on a first-come first-served basis, and a limited number are seen during each clinic.   [facility]  [address] Ether Griffins [place], [place] [phone]   Eligibility Requirements You must have lived in [place], [place], or [place]  counties for at least the last three months.   You cannot be eligible for state or federal sponsored [place], including [employer], [place], or [employer].   You generally cannot be eligible for healthcare insurance through your employer.    How to apply: Eligibility screenings are held every Tuesday and Wednesday afternoon from 1:00 pm until 4:00 pm. You do not need an appointment for the interview!  [facility] [address] [place], [place] [phone]   [facility]  [phone]   [facility] Health Department  [phone]   [facility]  [phone]    Behavioral Health Resources in the Community: Intensive Outpatient Programs Organization         Address  Phone  Notes  [facility] [address], [place], [place] [phone]   [facility] Outpatient [address] [place], [place] [phone]   [facility] [address], [place], [place]  [phone]   [facility] Mental Health [address]  [place], [place] [phone] or [phone]   Substance Abuse Resources Organization         Address  Phone  Notes  [facility]  [phone]   [facility]  [phone]   [facility]  [phone]   Floydene Flock  [phone]   Residential & Outpatient Substance Abuse Program  [phone]   Psychological Services Organization         Address  Phone  Notes  [facility] Behavioral Health  [phone]   [facility]  [phone]   [facility] Mental Health [address] [place] [phone] or [phone]    Mobile Crisis Teams Organization         Address  Phone  Notes  [facility], Mobile Crisis Care Unit  [phone]   [facility]  [address] [place], [place] [phone]   Doristine Locks [address] [place]  [place] [phone]    Self-Help/Support Groups Organization         Address  Phone             Notes  [facility] [place] - variety of support  groups  336- ([phone] Call for more information  Narcotics Anonymous (NA), [facility] [address], [employer] [place]  2 meetings at this location   Residential Sports administrator         Address  Phone  Notes  [facility] [address],    [place]  [phone]   [facility]  [address], [place] [address], [place], [place] [phone]   [facility] [address] [place], [place] [phone] Admissions: 8am-3pm M-F  [facility] [address]    [place], [place] [phone]   [facility] [address] [place], [place], [place] [phone]   [facility] [address]  [place], [place] [phone]   [facility] - Intensive Outpatient [address] Laurell Joseph[address], [place], [place] [phone]   [facility] ([facility] [address] [place].,  [place], [place] [phone] or [phone]   [facility] ([facility]) [address] [place], [place] [phone] Accepts Medicaid  [facility] [place] [address],  [place] [phone] Substance Abuse/Addiction Treatment   [facility] Organization         Address  Phone  Notes  [facility]  [phone]   Angie Fava, PhD [address] Ervin Knack [place], [place]   [phone] or [phone]   [facility]   [address] [place], [place] [phone]   [facility] [address], [place], [place] [phone] Insurance/Medicaid/sponsorship through [facility] [address]                                    [place], [place] [phone] Therapy/tele-psych/case  [facility] [address], [place] [phone]    Dr. Lolly Mustache  [phone]   [facility]  [employer] [facility] Dept. 1) [address] [place] 2) [address], [place] 3)  [address], [place] [phone]  [phone]   [facility] Child Abuse Hotline 330-166-9455 or [phone] (After Hours)

## 2013-11-15 NOTE — ED Notes (Signed)
Pt c/o headache, eye soreness, generalized body aches and sweats.

## 2013-11-27 ENCOUNTER — Emergency Department (HOSPITAL_COMMUNITY)
Admission: EM | Admit: 2013-11-27 | Discharge: 2013-11-28 | Disposition: A | Discharge status: Home / Self Care | Payer: Self-pay | Attending: Emergency Medicine | Admitting: Emergency Medicine

## 2013-11-27 ENCOUNTER — Encounter (HOSPITAL_COMMUNITY): Payer: Self-pay | Admitting: Emergency Medicine

## 2013-11-27 DIAGNOSIS — Z720 Tobacco use: Secondary | ICD-10-CM | POA: Insufficient documentation

## 2013-11-27 DIAGNOSIS — Z7951 Long term (current) use of inhaled steroids: Secondary | ICD-10-CM | POA: Insufficient documentation

## 2013-11-27 DIAGNOSIS — M542 Cervicalgia: Secondary | ICD-10-CM | POA: Insufficient documentation

## 2013-11-27 DIAGNOSIS — R51 Headache: Secondary | ICD-10-CM | POA: Insufficient documentation

## 2013-11-27 DIAGNOSIS — R519 Headache, unspecified: Secondary | ICD-10-CM

## 2013-11-27 LAB — CBG MONITORING, ED: Glucose-Capillary: 118 mg/dL — ABNORMAL HIGH (ref 70–99)

## 2013-11-27 NOTE — ED Notes (Signed)
Pt HR is varying from 30's to 70's in triage.
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
## 2013-11-28 ENCOUNTER — Emergency Department (HOSPITAL_COMMUNITY): Payer: Self-pay

## 2013-11-28 ENCOUNTER — Encounter (HOSPITAL_COMMUNITY): Payer: Self-pay

## 2013-11-28 LAB — BASIC METABOLIC PANEL
ANION GAP: 12 (ref 5–15)
BUN: 10 mg/dL (ref 6–23)
CO2: 25 mEq/L (ref 19–32)
CREATININE: 1.1 mg/dL (ref 0.50–1.35)
Calcium: 9.5 mg/dL (ref 8.4–10.5)
Chloride: 101 mEq/L (ref 96–112)
GFR, EST NON AFRICAN AMERICAN: 82 mL/min — AB (ref >90)
Glucose, Bld: 109 mg/dL — ABNORMAL HIGH (ref 70–99)
POTASSIUM: 4 meq/L (ref 3.7–5.3)
Sodium: 138 mEq/L (ref 137–147)

## 2013-11-28 LAB — CBC
HEMATOCRIT: 39 % (ref 39.0–52.0)
Hemoglobin: 12.6 g/dL — ABNORMAL LOW (ref 13.0–17.0)
MCH: 25.1 pg — ABNORMAL LOW (ref 26.0–34.0)
MCHC: 32.3 g/dL (ref 30.0–36.0)
MCV: 77.7 fL — ABNORMAL LOW (ref 78.0–100.0)
PLATELETS: 201 10*3/uL (ref 150–400)
RBC: 5.02 MIL/uL (ref 4.22–5.81)
RDW: 14.8 % (ref 11.5–15.5)
WBC: 8.6 10*3/uL (ref 4.0–10.5)

## 2013-11-28 MED ORDER — ACETAMINOPHEN 500 MG PO TABS
1000.0000 mg | ORAL_TABLET | Freq: Once | ORAL | Status: AC
  Administered 2013-11-28: 1000 mg via ORAL
  Filled 2013-11-28: qty 2

## 2013-11-28 MED ORDER — METOCLOPRAMIDE HCL 10 MG PO TABS: 10.0000 mg | ORAL_TABLET | Freq: Two times a day (BID) | ORAL | Status: AC | PRN

## 2013-11-28 MED ORDER — METOCLOPRAMIDE HCL 5 MG/ML IJ SOLN
10.0000 mg | Freq: Once | INTRAMUSCULAR | Status: AC
  Administered 2013-11-28: 10 mg via INTRAVENOUS
  Filled 2013-11-28: qty 2

## 2013-11-28 MED ORDER — DIPHENHYDRAMINE HCL 50 MG/ML IJ SOLN
50.0000 mg | Freq: Once | INTRAMUSCULAR | Status: AC
  Administered 2013-11-28: 50 mg via INTRAVENOUS
  Filled 2013-11-28: qty 1

## 2013-11-28 MED ORDER — LIDOCAINE-EPINEPHRINE 1 %-1:100000 IJ SOLN
10.0000 mL | Freq: Once | INTRAMUSCULAR | Status: DC
  Filled 2013-11-28: qty 10

## 2013-11-28 MED ORDER — SODIUM CHLORIDE 0.9 % IV BOLUS (SEPSIS)
1000.0000 mL | Freq: Once | INTRAVENOUS | Status: AC
  Administered 2013-11-28: 1000 mL via INTRAVENOUS

## 2013-11-28 MED ORDER — LIDOCAINE HCL 1 % IJ SOLN
INTRAMUSCULAR | Status: DC
Start: 2013-11-28 — End: 2013-11-28
  Filled 2013-11-28: qty 20

## 2013-11-28 NOTE — Discharge Instructions (Signed)
Headaches, Frequently Asked Questions Mr. Andre Peters, you were seen today for headache. Your CT scan and labs were normal. You are refusing to have a spinal tap. You could have meningitis or a aneurysm or bleed in your brain. Come back to the emergency department immediately to finishing her evaluation. If you do not wish to do so followup with her primary care physician during your appointment this coming Thursday. If any symptoms worsen come back to emergency department immediately. Thank you. MIGRAINE HEADACHES Q: What is migraine? What causes it? How can I treat it? A: Generally, migraine headaches begin as a dull ache. Then they develop into a constant, throbbing, and pulsating pain. You may experience pain at the temples. You may experience pain at the front or back of one or both sides of the head. The pain is usually accompanied by a combination of:  Nausea.  Vomiting.  Sensitivity to light and noise. Some people (about 15%) experience an aura (see below) before an attack. The cause of migraine is believed to be chemical reactions in the brain. Treatment for migraine may include over-the-counter or prescription medications. It may also include self-help techniques. These include relaxation training and biofeedback.  Q: What is an aura? A: About 15% of people with migraine get an "aura". This is a sign of neurological symptoms that occur before a migraine headache. You may see wavy or jagged lines, dots, or flashing lights. You might experience tunnel vision or blind spots in one or both eyes. The aura can include visual or auditory hallucinations (something imagined). It may include disruptions in smell (such as strange odors), taste or touch. Other symptoms include:  Numbness.  A "pins and needles" sensation.  Difficulty in recalling or speaking the correct word. These neurological events may last as long as 60 minutes. These symptoms will fade as the headache begins. Q: What is a  trigger? A: Certain physical or environmental factors can lead to or "trigger" a migraine. These include:  Foods.  Hormonal changes.  Weather.  Stress. It is important to remember that triggers are different for everyone. To help prevent migraine attacks, you need to figure out which triggers affect you. Keep a headache diary. This is a good way to track triggers. The diary will help you talk to your healthcare professional about your condition. Q: Does weather affect migraines? A: Bright sunshine, hot, humid conditions, and drastic changes in barometric pressure may lead to, or "trigger," a migraine attack in some people. But studies have shown that weather does not act as a trigger for everyone with migraines. Q: What is the link between migraine and hormones? A: Hormones start and regulate many of your body's functions. Hormones keep your body in balance within a constantly changing environment. The levels of hormones in your body are unbalanced at times. Examples are during menstruation, pregnancy, or menopause. That can lead to a migraine attack. In fact, about three quarters of all women with migraine report that their attacks are related to the menstrual cycle.  Q: Is there an increased risk of stroke for migraine sufferers? A: The likelihood of a migraine attack causing a stroke is very remote. That is not to say that migraine sufferers cannot have a stroke associated with their migraines. In persons under age 48, the most common associated factor for stroke is migraine headache. But over the course of a person's normal life span, the occurrence of migraine headache may actually be associated with a reduced risk of dying from cerebrovascular  disease due to stroke.  Q: What are acute medications for migraine? A: Acute medications are used to treat the pain of the headache after it has started. Examples over-the-counter medications, NSAIDs, ergots, and triptans.  Q: What are the  triptans? A: Triptans are the newest class of abortive medications. They are specifically targeted to treat migraine. Triptans are vasoconstrictors. They moderate some chemical reactions in the brain. The triptans work on receptors in your brain. Triptans help to restore the balance of a neurotransmitter called serotonin. Fluctuations in levels of serotonin are thought to be a main cause of migraine.  Q: Are over-the-counter medications for migraine effective? A: Over-the-counter, or "OTC," medications may be effective in relieving mild to moderate pain and associated symptoms of migraine. But you should see your caregiver before beginning any treatment regimen for migraine.  Q: What are preventive medications for migraine? A: Preventive medications for migraine are sometimes referred to as "prophylactic" treatments. They are used to reduce the frequency, severity, and length of migraine attacks. Examples of preventive medications include antiepileptic medications, antidepressants, beta-blockers, calcium channel blockers, and NSAIDs (nonsteroidal anti-inflammatory drugs). Q: Why are anticonvulsants used to treat migraine? A: During the past few years, there has been an increased interest in antiepileptic drugs for the prevention of migraine. They are sometimes referred to as "anticonvulsants". Both epilepsy and migraine may be caused by similar reactions in the brain.  Q: Why are antidepressants used to treat migraine? A: Antidepressants are typically used to treat people with depression. They may reduce migraine frequency by regulating chemical levels, such as serotonin, in the brain.  Q: What alternative therapies are used to treat migraine? A: The term "alternative therapies" is often used to describe treatments considered outside the scope of conventional Western medicine. Examples of alternative therapy include acupuncture, acupressure, and yoga. Another common alternative treatment is herbal  therapy. Some herbs are believed to relieve headache pain. Always discuss alternative therapies with your caregiver before proceeding. Some herbal products contain arsenic and other toxins. TENSION HEADACHES Q: What is a tension-type headache? What causes it? How can I treat it? A: Tension-type headaches occur randomly. They are often the result of temporary stress, anxiety, fatigue, or anger. Symptoms include soreness in your temples, a tightening band-like sensation around your head (a "vice-like" ache). Symptoms can also include a pulling feeling, pressure sensations, and contracting head and neck muscles. The headache begins in your forehead, temples, or the back of your head and neck. Treatment for tension-type headache may include over-the-counter or prescription medications. Treatment may also include self-help techniques such as relaxation training and biofeedback. CLUSTER HEADACHES Q: What is a cluster headache? What causes it? How can I treat it? A: Cluster headache gets its name because the attacks come in groups. The pain arrives with little, if any, warning. It is usually on one side of the head. A tearing or bloodshot eye and a runny nose on the same side of the headache may also accompany the pain. Cluster headaches are believed to be caused by chemical reactions in the brain. They have been described as the most severe and intense of any headache type. Treatment for cluster headache includes prescription medication and oxygen. SINUS HEADACHES Q: What is a sinus headache? What causes it? How can I treat it? A: When a cavity in the bones of the face and skull (a sinus) becomes inflamed, the inflammation will cause localized pain. This condition is usually the result of an allergic reaction, a tumor, or an  infection. If your headache is caused by a sinus blockage, such as an infection, you will probably have a fever. An x-ray will confirm a sinus blockage. Your caregiver's treatment might  include antibiotics for the infection, as well as antihistamines or decongestants.  REBOUND HEADACHES Q: What is a rebound headache? What causes it? How can I treat it? A: A pattern of taking acute headache medications too often can lead to a condition known as "rebound headache." A pattern of taking too much headache medication includes taking it more than 2 days per week or in excessive amounts. That means more than the label or a caregiver advises. With rebound headaches, your medications not only stop relieving pain, they actually begin to cause headaches. Doctors treat rebound headache by tapering the medication that is being overused. Sometimes your caregiver will gradually substitute a different type of treatment or medication. Stopping may be a challenge. Regularly overusing a medication increases the potential for serious side effects. Consult a caregiver if you regularly use headache medications more than 2 days per week or more than the label advises. ADDITIONAL QUESTIONS AND ANSWERS Q: What is biofeedback? A: Biofeedback is a self-help treatment. Biofeedback uses special equipment to monitor your body's involuntary physical responses. Biofeedback monitors:  Breathing.  Pulse.  Heart rate.  Temperature.  Muscle tension.  Brain activity. Biofeedback helps you refine and perfect your relaxation exercises. You learn to control the physical responses that are related to stress. Once the technique has been mastered, you do not need the equipment any more. Q: Are headaches hereditary? A: Four out of five (80%) of people that suffer report a family history of migraine. Scientists are not sure if this is genetic or a family predisposition. Despite the uncertainty, a child has a 50% chance of having migraine if one parent suffers. The child has a 75% chance if both parents suffer.  Q: Can children get headaches? A: By the time they reach high school, most young people have experienced some  type of headache. Many safe and effective approaches or medications can prevent a headache from occurring or stop it after it has begun.  Q: What type of doctor should I see to diagnose and treat my headache? A: Start with your primary caregiver. Discuss his or her experience and approach to headaches. Discuss methods of classification, diagnosis, and treatment. Your caregiver may decide to recommend you to a headache specialist, depending upon your symptoms or other physical conditions. Having diabetes, allergies, etc., may require a more comprehensive and inclusive approach to your headache. The National Head[employer] Foundation will provide, upon request, a list of [facility] physician members in your state. Document Released: 04/25/2003 Document Revised: 04/27/2011 Document Reviewed: 10/03/2007 [facility] Patient Information 2015 [employer] [place]. This information is not intended to replace advice given to you by your health care provider. Make sure you discuss any questions you have with your health care provider.

## 2013-11-28 NOTE — ED Provider Notes (Addendum)
CSN: [id number]     Arrival date & time 11/27/13  2239 History   First MD Initiated Contact with Patient 11/28/13 0114     Chief Complaint  Patient presents with  . Headache  . Loss of Consciousness     (Consider location/radiation/quality/duration/timing/severity/associated sxs/prior Treatment) HPI Andre Peters is a 38 y.o. male with no significant past medical history coming in with headache. Patient states she normally does not get headache, and this is the most severe. It is in the front and radiates to the back. He has photophobia as well. Patient had a syncopal episode yesterday. His headache started September 27 has been diagnosed with viral illness. He states it is worse with moving his neck. His neck does feel stiff. He denies any other neurological symptoms such as muscle weakness numbness or tingling. He denies any fevers or sick contacts. Patient denies any aneurysms in himself or the family. He has no chest pain or shortness of breath. Patient has no further complaints.   10 Systems reviewed and are negative for acute change except as noted in the HPI.     History reviewed. No pertinent past medical history. History reviewed. No pertinent past surgical history. No family history on file. History  Substance Use Topics  . Smoking status: Current Every Day Smoker -- 1.00 packs/day    Types: Cigarettes  . Smokeless tobacco: Not on file  . Alcohol Use: Yes     Comment: occ    Review of Systems    Allergies  Fish allergy  Home Medications   Prior to Admission medications   Medication Sig Start Date End Date Taking? Authorizing Provider  fluticasone (FLONASE) 50 MCG/ACT nasal spray Place 2 sprays into both nostrils daily. 11/15/13  Yes Hannah Muthersbaugh, PA-C  methocarbamol (ROBAXIN-750) 750 MG tablet Take 1 tablet (750 mg total) by mouth every 6 (six) hours as needed for muscle spasms. 11/12/13  Yes Olivia Mackielga M Otter, MD   BP 162/110  Pulse 56  Temp(Src) 98.7  F (37.1 C) (Oral)  Resp 18  Ht 5\' 10"  (1.778 m)  Wt 190 lb (86.183 kg)  BMI 27.26 kg/m2  SpO2 97% Physical Exam  Nursing note and vitals reviewed. Constitutional: He is oriented to person, place, and time. Vital signs are normal. He appears well-developed and well-nourished.  Non-toxic appearance. He does not appear ill. No distress.  HENT:  Head: Normocephalic and atraumatic.  Nose: Nose normal.  Mouth/Throat: Oropharynx is clear and moist. No oropharyngeal exudate.  Eyes: Conjunctivae and EOM are normal. Pupils are equal, round, and reactive to light. No scleral icterus.  Neck: Normal range of motion. Neck supple. No tracheal deviation, no edema, no erythema and normal range of motion present. No mass and no thyromegaly present.  Cardiovascular: Normal rate, regular rhythm, S1 normal, S2 normal, normal heart sounds, intact distal pulses and normal pulses.  Exam reveals no gallop and no friction rub.   No murmur heard. Pulses:      Radial pulses are 2+ on the right side, and 2+ on the left side.       Dorsalis pedis pulses are 2+ on the right side, and 2+ on the left side.  Pulmonary/Chest: Effort normal and breath sounds normal. No respiratory distress. He has no wheezes. He has no rhonchi. He has no rales.  Abdominal: Soft. Normal appearance and bowel sounds are normal. He exhibits no distension, no ascites and no mass. There is no hepatosplenomegaly. There is no tenderness. There is no  rebound, no guarding and no CVA tenderness.  Musculoskeletal: He exhibits tenderness. He exhibits no edema.  Patient is limited range of motion of his neck. He has headaches when he flexes his neck. He has positive Kernig and Brudzinski signs  Lymphadenopathy:    He has no cervical adenopathy.  Neurological: He is alert and oriented to person, place, and time. He has normal strength. No cranial nerve deficit or sensory deficit. He exhibits normal muscle tone. Coordination normal. GCS eye subscore is 4.  GCS verbal subscore is 5. GCS motor subscore is 6.  5 out of 5 strength x4 extremity. Normal sensation x4 strength. Normal cerebellar testing.  Skin: Skin is warm, dry and intact. No petechiae and no rash noted. He is not diaphoretic. No erythema. No pallor.  Psychiatric: He has a normal mood and affect. His behavior is normal. Judgment normal.    ED Course  Procedures (including critical care time) Labs Review Labs Reviewed  CBC - Abnormal; Notable for the following:    Hemoglobin 12.6 (*)    MCV 77.7 (*)    MCH 25.1 (*)    All other components within normal limits  BASIC METABOLIC PANEL - Abnormal; Notable for the following:    Glucose, Bld 109 (*)    GFR calc non Af Amer 82 (*)    All other components within normal limits  CBG MONITORING, ED - Abnormal; Notable for the following:    Glucose-Capillary 118 (*)    All other components within normal limits  GRAM STAIN  CSF CULTURE  GLUCOSE, CSF  PROTEIN, CSF  CSF CELL COUNT WITH DIFFERENTIAL  HERPES SIMPLEX VIRUS(HSV) DNA BY PCR  CSF CELL COUNT WITH DIFFERENTIAL    Imaging Review Ct Head Wo Contrast  11/28/2013   CLINICAL DATA:  Severe headache since 09/27. Recent diagnosis of migraines. Initial encounter.  EXAM: CT HEAD WITHOUT CONTRAST  TECHNIQUE: Contiguous axial images were obtained from the base of the skull through the vertex without intravenous contrast.  COMPARISON:  None  FINDINGS: Gray-white differentiation is maintained. No CT evidence of acute large territory infarct. No intraparenchymal or extra-axial mass or hemorrhage. Normal size and configuration of the ventricles and basilar cisterns. No midline shift. Limited visualization of the paranasal sinuses and mastoid air cells are normal. No air-fluid levels. Regional soft tissues appear normal. No displaced calvarial fracture.  IMPRESSION: Negative noncontrast head CT.   Electronically Signed   By: Simonne Come M.D.   On: 11/28/2013 02:31     EKG Interpretation None       MDM   Final diagnoses:  None    Patient does emergency department for headache. I do have a high suspicion of viral meningitis in this patient. CT scan is negative. He was given Reglan Tylenol and Benadryl, this significantly relieved his headache. Patient is currently refusing lumbar puncture. He is afraid to get the procedure. I did explain to him the possibilities of bacterial meningitis, subarachnoid hemorrhage, viral meningitis. He understands these abilities and still wants to leave again medical advice and he does not want to get a spinal tap. Return precautions were given to come back immediately. Patient does have decision making capacity.   He also has followup in 2 days with his primary care physician. He is requesting Reglan tablets to go home with. He often has bradycardia while sleeping ad is asymptomatic from it.  His vital signs remain within normal limits and he is being discharged AMA.    Tomasita Crumble, MD  11/28/13 [id number]  Tomasita CrumbleAdeleke Jasan Doughtie, MD 11/28/13 1429  Tomasita CrumbleAdeleke Zeniya Lapidus, MD 11/28/13 1430

## 2015-01-30 IMAGING — CT CT HEAD W/O CM
2 series · 17 of 30 positions shown, 20 images · non-contrast
Comparison: None

CLINICAL DATA: Severe headache since [DATE]. Recent diagnosis of
migraines. Initial encounter.

EXAM:
CT HEAD WITHOUT CONTRAST
TECHNIQUE: Contiguous axial images were obtained from the base of the skull
through the vertex without intravenous contrast.

[Series 2: head w/o · axial · non-contrast · 0.45mm/px · z∈[-169,-44]mm · 9 of 33 slices shown, 12 images]
[im 4/33  brain]
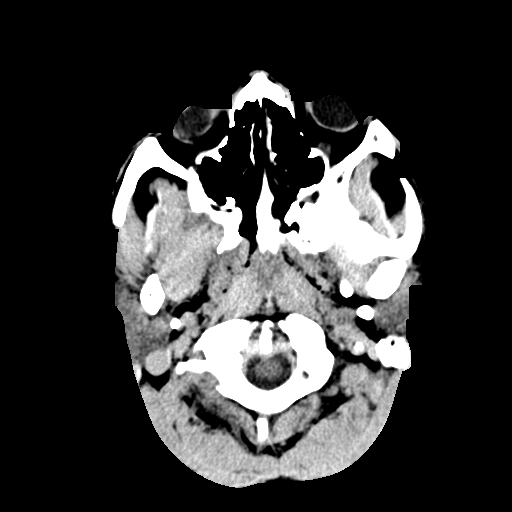
[im 4/33  bone]
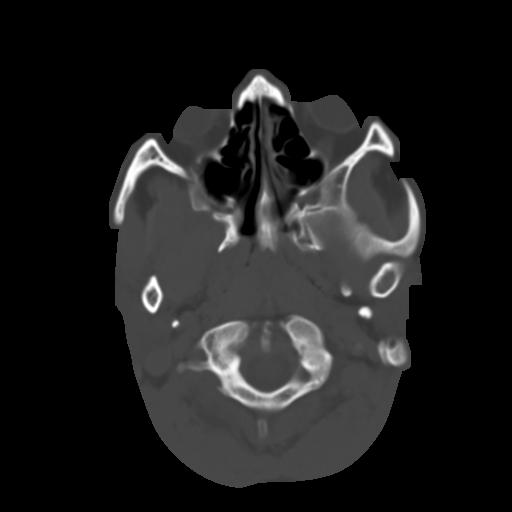
[im 7/33  brain]
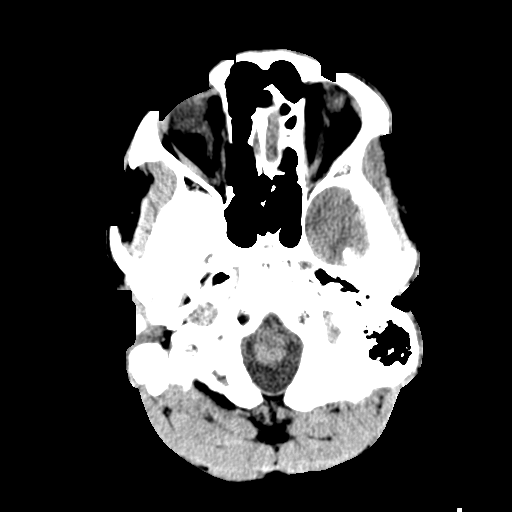
[im 10/33  brain]
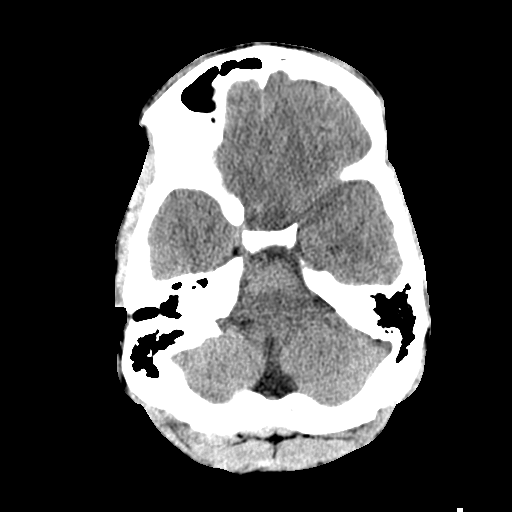
[im 13/33  brain]
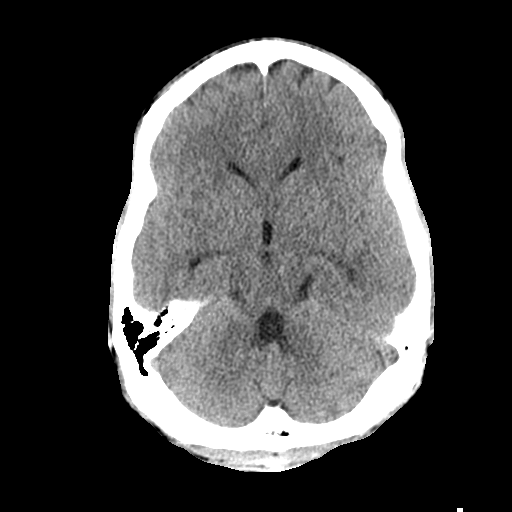
[im 17/33  brain]
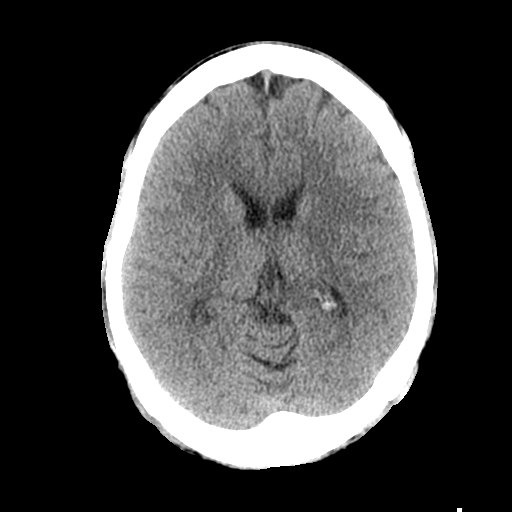
[im 17/33  bone]
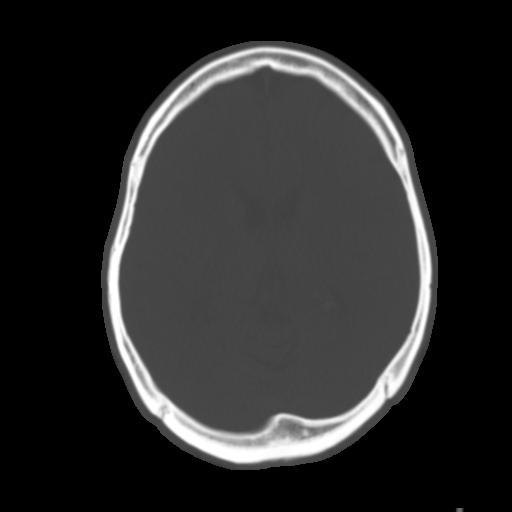
[im 20/33  brain]
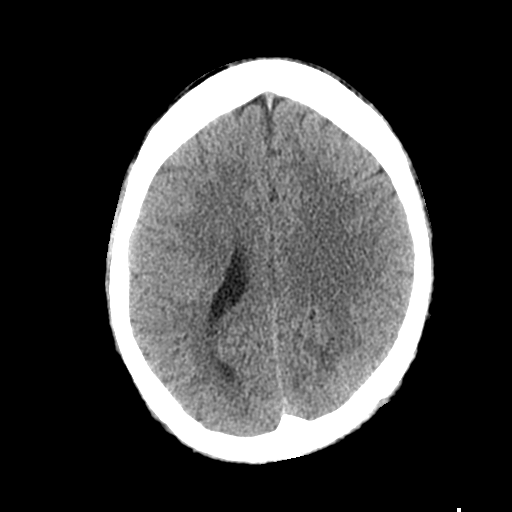
[im 23/33  brain]
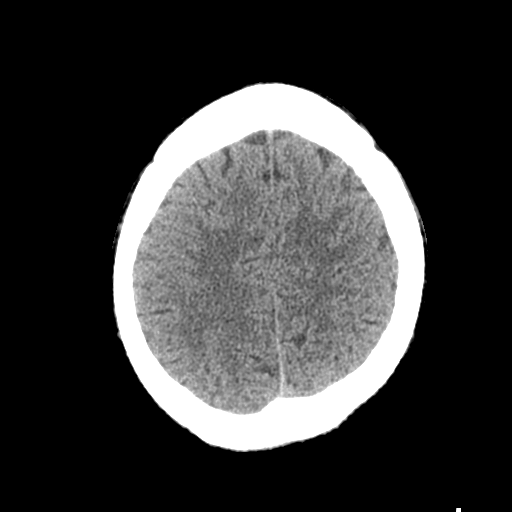
[im 26/33  brain]
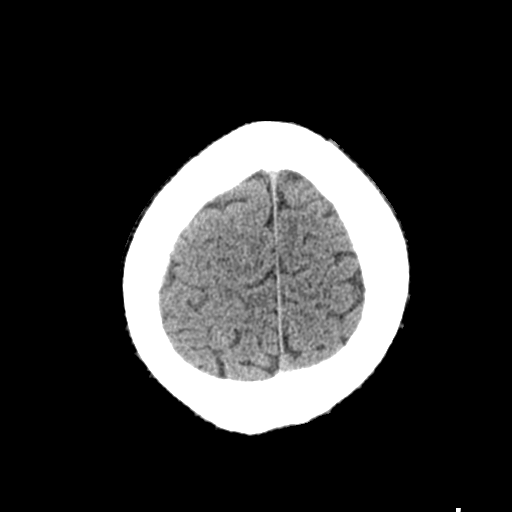
[im 29/33  brain]
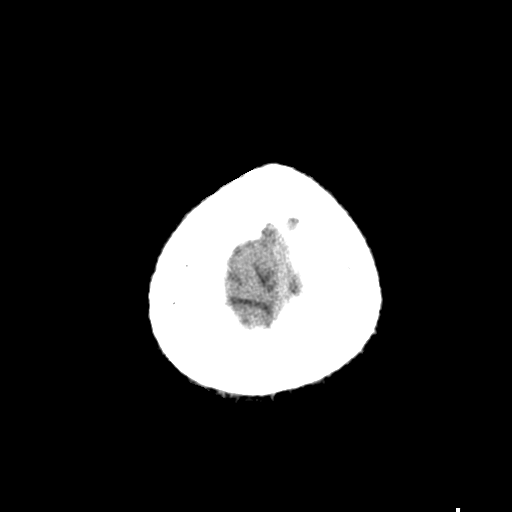
[im 29/33  bone]
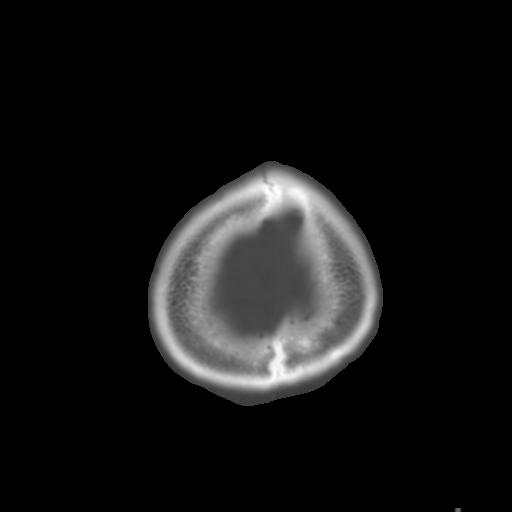

[Series 3: bone windows · axial · 0.45mm/px · z∈[-169,-43]mm · 8 of 54 slices shown]
[im 6/54  bone]
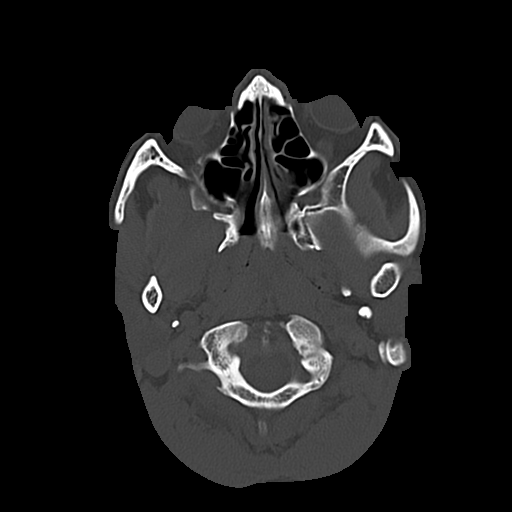
[im 12/54  bone]
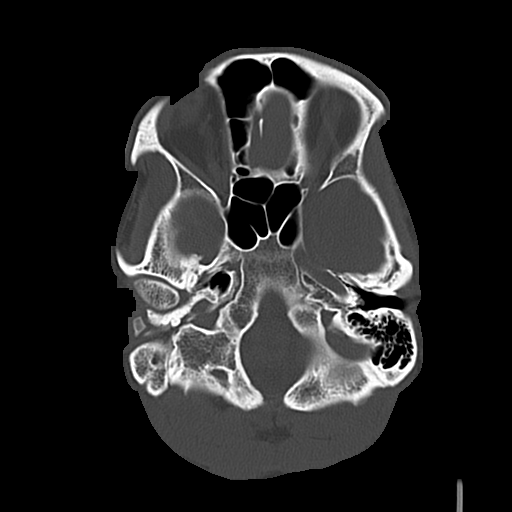
[im 18/54  bone]
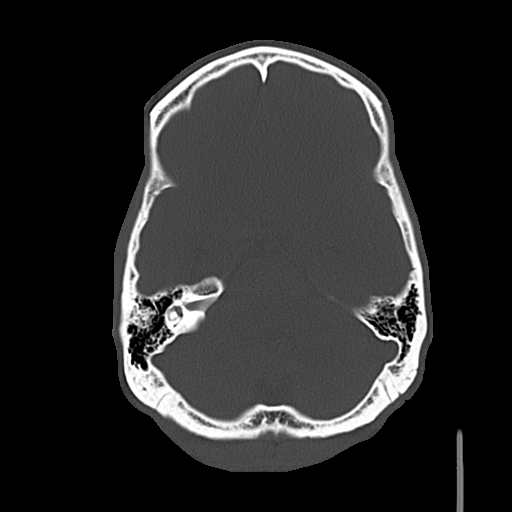
[im 24/54  bone]
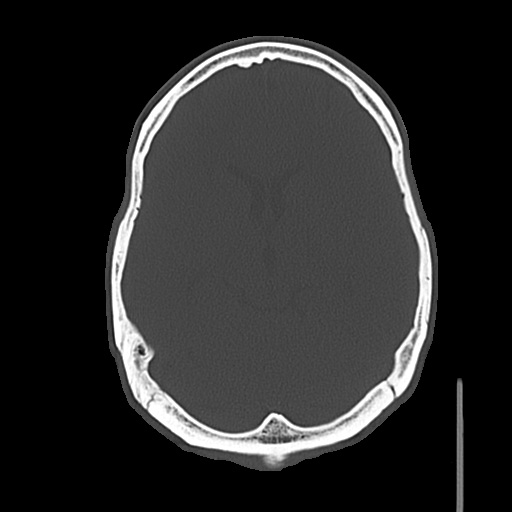
[im 30/54  bone]
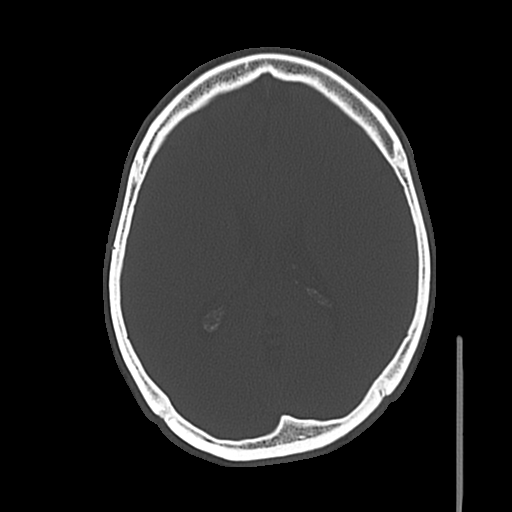
[im 36/54  bone]
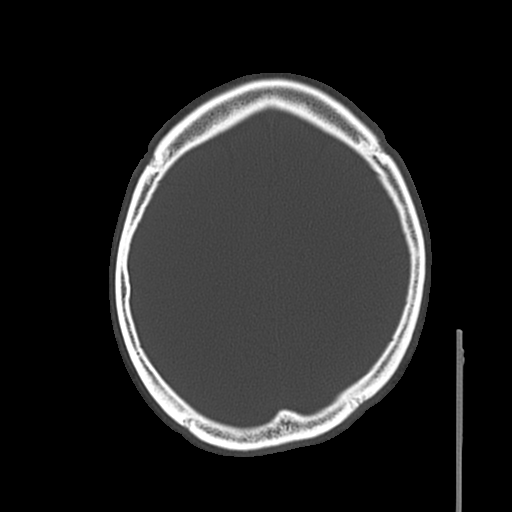
[im 42/54  bone]
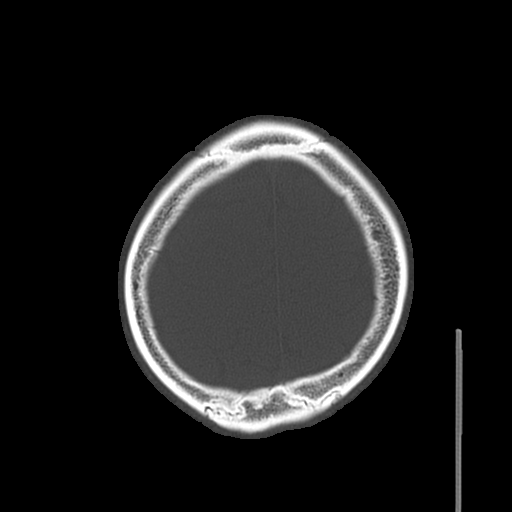
[im 48/54  bone]
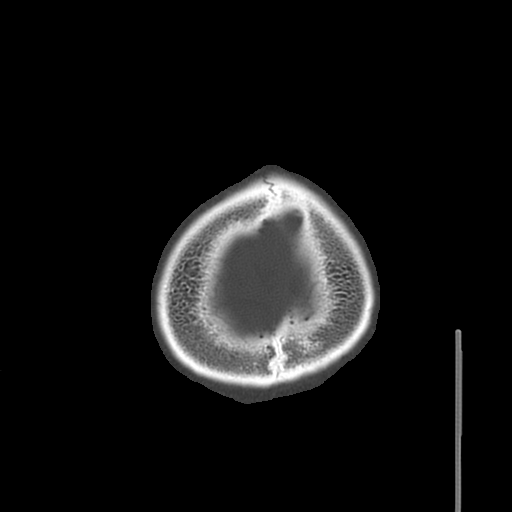

[17 of 30 positions shown; findings below may reference images not displayed]

FINDINGS: Gray-white differentiation is maintained. No CT evidence of acute
large territory infarct. No intraparenchymal or extra-axial mass or
hemorrhage. Normal size and configuration of the ventricles and
basilar cisterns. No midline shift. Limited visualization of the
paranasal sinuses and mastoid air cells are normal. No air-fluid
levels. Regional soft tissues appear normal. No displaced calvarial
fracture.
IMPRESSION: Negative noncontrast head CT.
